# Patient Record
Sex: Female | Born: 1965 | Race: Black or African American | Hispanic: No | Marital: Single | Smoking: Current every day smoker
Health system: Southern US, Community
[De-identification: ages and names within clinical notes are randomized; demographics above are authoritative.]

## PROBLEM LIST (undated history)

## (undated) DIAGNOSIS — E079 Disorder of thyroid, unspecified: Secondary | ICD-10-CM

## (undated) DIAGNOSIS — D649 Anemia, unspecified: Secondary | ICD-10-CM

## (undated) HISTORY — PX: OTHER SURGICAL HISTORY: SHX169

---

## 2007-12-13 ENCOUNTER — Emergency Department: Payer: Self-pay | Admitting: Emergency Medicine

## 2008-01-13 ENCOUNTER — Ambulatory Visit: Payer: Self-pay | Admitting: General Surgery

## 2008-01-25 ENCOUNTER — Ambulatory Visit: Payer: Self-pay | Admitting: General Surgery

## 2008-10-20 ENCOUNTER — Ambulatory Visit: Payer: Self-pay | Admitting: General Surgery

## 2008-10-28 ENCOUNTER — Ambulatory Visit: Payer: Self-pay | Admitting: General Surgery

## 2011-05-27 ENCOUNTER — Emergency Department: Payer: Self-pay

## 2012-08-03 ENCOUNTER — Ambulatory Visit: Payer: Self-pay

## 2013-10-17 IMAGING — NM NM THYROID IMAGING W/ UPTAKE SINGLE (24 HR)
1 series · 3 of 3 positions shown · non-contrast
Comparison: none

REASON FOR EXAM: hyperthyroidism Dr Samantha Med [REDACTED] prospect
COMMENTS:

PROCEDURE:     KNM - KNM THYROID N-1FB 24HR [DATE]  [DATE]
RESULT:     The patient received 142.3 mCi of N-1FB orally. The patient's
six-hour uptake is elevated at 38% and the 23 hour uptake is elevated at
58%. There is fairly symmetric uptake of the radiopharmaceutical within both
thyroid lobes.

[Series 1000: (id) thyroid scan · 2.40mm/px · 3 of 3 slices shown]
[im 1/3  full-range]
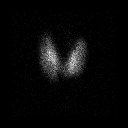
[im 2/3  full-range]
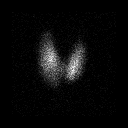
[im 3/3  full-range]
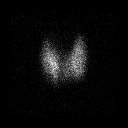

[3 of 3 positions shown; findings below may reference images not displayed]

IMPRESSION: There is increased uptake of I 123 consistent with
hyperthyroidism. Diffusely increased uptake within both thyroid lobes is
demonstrated.

[REDACTED]

## 2013-12-09 ENCOUNTER — Ambulatory Visit: Payer: Self-pay

## 2014-12-09 ENCOUNTER — Encounter: Payer: Self-pay | Admitting: Gynecology

## 2014-12-09 ENCOUNTER — Ambulatory Visit
Admission: EM | Admit: 2014-12-09 | Discharge: 2014-12-09 | Disposition: A | Discharge status: Home / Self Care | Payer: Self-pay | Attending: Family Medicine | Admitting: Family Medicine

## 2014-12-09 DIAGNOSIS — K5733 Diverticulitis of large intestine without perforation or abscess with bleeding: Secondary | ICD-10-CM

## 2014-12-09 HISTORY — DX: Anemia, unspecified: D64.9

## 2014-12-09 HISTORY — DX: Disorder of thyroid, unspecified: E07.9
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
## 2014-12-09 NOTE — Discharge Instructions (Signed)
Diverticulitis °Diverticulitis is when small pockets that have formed in your colon (large intestine) become infected or swollen. °HOME CARE °· Follow your doctor's instructions. °· Follow a special diet if told by your doctor. °· When you feel better, your doctor may tell you to change your diet. You may be told to eat a lot of fiber. Fruits and vegetables are good sources of fiber. Fiber makes it easier to poop (have bowel movements). °· Take supplements or probiotics as told by your doctor. °· Only take medicines as told by your doctor. °· Keep all follow-up visits with your doctor. °GET HELP IF: °· Your pain does not get better. °· You have a hard time eating food. °· You are not pooping like normal. °GET HELP RIGHT AWAY IF: °· Your pain gets worse. °· Your problems do not get better. °· Your problems suddenly get worse. °· You have a fever. °· You keep throwing up (vomiting). °· You have bloody or black, tarry poop (stool). °MAKE SURE YOU:  °· Understand these instructions. °· Will watch your condition. °· Will get help right away if you are not doing well or get worse. °Document Released: 12/25/2007 Document Revised: 07/13/2013 Document Reviewed: 06/02/2013 °[employer]® Patient Information ©2015 [employer]. This information is not intended to replace advice given to you by your health care provider. Make sure you discuss any questions you have with your health care provider. ° °
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
## 2017-12-05 ENCOUNTER — Ambulatory Visit
Admission: RE | Admit: 2017-12-05 | Discharge: 2017-12-05 | Disposition: A | Discharge status: Home / Self Care | Payer: Disability Insurance | Source: Ambulatory Visit | Attending: Family Medicine | Admitting: Family Medicine

## 2017-12-05 ENCOUNTER — Other Ambulatory Visit: Payer: Self-pay | Admitting: Family Medicine

## 2017-12-05 DIAGNOSIS — M1990 Unspecified osteoarthritis, unspecified site: Secondary | ICD-10-CM

## 2017-12-05 DIAGNOSIS — M545 Low back pain: Secondary | ICD-10-CM | POA: Insufficient documentation
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
